# Patient Record
Sex: Female | Born: 1995 | Race: Black or African American | Hispanic: No | Marital: Single | Smoking: Never smoker
Health system: Southern US, Community
[De-identification: ages and names within clinical notes are randomized; demographics above are authoritative.]

## PROBLEM LIST (undated history)

## (undated) DIAGNOSIS — J45909 Unspecified asthma, uncomplicated: Secondary | ICD-10-CM

## (undated) HISTORY — DX: Unspecified asthma, uncomplicated: J45.909

---
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
## 2014-01-13 ENCOUNTER — Encounter: Payer: Self-pay | Admitting: *Deleted

## 2014-01-14 ENCOUNTER — Encounter: Payer: Self-pay | Admitting: Obstetrics & Gynecology

## 2015-11-03 ENCOUNTER — Emergency Department (HOSPITAL_COMMUNITY): Payer: No Typology Code available for payment source

## 2015-11-03 ENCOUNTER — Emergency Department (HOSPITAL_COMMUNITY)
Admission: EM | Admit: 2015-11-03 | Discharge: 2015-11-04 | Disposition: A | Discharge status: Home / Self Care | Payer: No Typology Code available for payment source | Attending: Emergency Medicine | Admitting: Emergency Medicine

## 2015-11-03 ENCOUNTER — Encounter (HOSPITAL_COMMUNITY): Payer: Self-pay | Admitting: *Deleted

## 2015-11-03 DIAGNOSIS — F17200 Nicotine dependence, unspecified, uncomplicated: Secondary | ICD-10-CM | POA: Insufficient documentation

## 2015-11-03 DIAGNOSIS — S39012A Strain of muscle, fascia and tendon of lower back, initial encounter: Secondary | ICD-10-CM | POA: Diagnosis not present

## 2015-11-03 DIAGNOSIS — J45909 Unspecified asthma, uncomplicated: Secondary | ICD-10-CM | POA: Insufficient documentation

## 2015-11-03 DIAGNOSIS — Y939 Activity, unspecified: Secondary | ICD-10-CM | POA: Diagnosis not present

## 2015-11-03 DIAGNOSIS — Y92410 Unspecified street and highway as the place of occurrence of the external cause: Secondary | ICD-10-CM | POA: Diagnosis not present

## 2015-11-03 DIAGNOSIS — Y999 Unspecified external cause status: Secondary | ICD-10-CM | POA: Insufficient documentation

## 2015-11-03 DIAGNOSIS — M542 Cervicalgia: Secondary | ICD-10-CM | POA: Insufficient documentation

## 2015-11-03 DIAGNOSIS — S3992XA Unspecified injury of lower back, initial encounter: Secondary | ICD-10-CM | POA: Diagnosis present

## 2015-11-03 LAB — POC URINE PREG, ED: Preg Test, Ur: NEGATIVE

## 2015-11-03 MED ORDER — NAPROXEN 500 MG PO TABS: 500.0000 mg | ORAL_TABLET | Freq: Two times a day (BID) | ORAL | 0 refills | Status: DC

## 2015-11-03 MED ORDER — HYDROCODONE-ACETAMINOPHEN 5-325 MG PO TABS
1.0000 | ORAL_TABLET | Freq: Once | ORAL | Status: AC
  Administered 2015-11-03: 1 via ORAL
  Filled 2015-11-03: qty 1

## 2015-11-03 MED ORDER — CYCLOBENZAPRINE HCL 10 MG PO TABS
10.0000 mg | ORAL_TABLET | Freq: Once | ORAL | Status: AC
  Administered 2015-11-03: 10 mg via ORAL
  Filled 2015-11-03: qty 1

## 2015-11-03 MED ORDER — METHOCARBAMOL 500 MG PO TABS: 500.0000 mg | ORAL_TABLET | Freq: Two times a day (BID) | ORAL | 0 refills | Status: DC

## 2015-11-03 NOTE — ED Notes (Signed)
NP to see and assess patient before RN assessment. See NP note. 
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
## 2015-11-03 NOTE — ED Notes (Signed)
Pt ambulatory to restroom with steady gait.
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
## 2015-11-03 NOTE — ED Notes (Signed)
EMS reported HR 90, R 16, BP 144/88 CBG 120

## 2015-11-03 NOTE — Discharge Instructions (Signed)
Do not drive while taking the muscle relaxant as it will make you sleepy. °

## 2015-11-04 NOTE — ED Notes (Signed)
Patient verbalized understanding of discharge instructions and denies any further needs or questions at this time. VS stable. Patient ambulatory with steady gait, RN escorted to ED entrance in wheelchair.   

## 2016-10-11 ENCOUNTER — Encounter (HOSPITAL_COMMUNITY): Payer: Self-pay | Admitting: Emergency Medicine

## 2016-10-11 ENCOUNTER — Emergency Department (HOSPITAL_COMMUNITY)
Admission: EM | Admit: 2016-10-11 | Discharge: 2016-10-11 | Discharge status: Against Medical Advice | Payer: Self-pay | Attending: Emergency Medicine | Admitting: Emergency Medicine

## 2016-10-11 DIAGNOSIS — Z5321 Procedure and treatment not carried out due to patient leaving prior to being seen by health care provider: Secondary | ICD-10-CM | POA: Insufficient documentation

## 2016-10-11 NOTE — ED Notes (Signed)
No answer

## 2016-10-11 NOTE — ED Notes (Signed)
No responce 
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
## 2017-09-09 ENCOUNTER — Emergency Department (HOSPITAL_COMMUNITY)
Admission: EM | Admit: 2017-09-09 | Discharge: 2017-09-09 | Disposition: A | Discharge status: Home / Self Care | Payer: Self-pay | Attending: Emergency Medicine | Admitting: Emergency Medicine

## 2017-09-09 ENCOUNTER — Encounter (HOSPITAL_COMMUNITY): Payer: Self-pay | Admitting: Emergency Medicine

## 2017-09-09 DIAGNOSIS — Y929 Unspecified place or not applicable: Secondary | ICD-10-CM | POA: Insufficient documentation

## 2017-09-09 DIAGNOSIS — F17200 Nicotine dependence, unspecified, uncomplicated: Secondary | ICD-10-CM | POA: Insufficient documentation

## 2017-09-09 DIAGNOSIS — M25561 Pain in right knee: Secondary | ICD-10-CM | POA: Insufficient documentation

## 2017-09-09 DIAGNOSIS — R51 Headache: Secondary | ICD-10-CM | POA: Insufficient documentation

## 2017-09-09 DIAGNOSIS — Y999 Unspecified external cause status: Secondary | ICD-10-CM | POA: Insufficient documentation

## 2017-09-09 DIAGNOSIS — R519 Headache, unspecified: Secondary | ICD-10-CM

## 2017-09-09 DIAGNOSIS — M25562 Pain in left knee: Secondary | ICD-10-CM | POA: Insufficient documentation

## 2017-09-09 DIAGNOSIS — M25512 Pain in left shoulder: Secondary | ICD-10-CM | POA: Insufficient documentation

## 2017-09-09 DIAGNOSIS — Z79899 Other long term (current) drug therapy: Secondary | ICD-10-CM | POA: Insufficient documentation

## 2017-09-09 DIAGNOSIS — J45909 Unspecified asthma, uncomplicated: Secondary | ICD-10-CM | POA: Insufficient documentation

## 2017-09-09 DIAGNOSIS — Y939 Activity, unspecified: Secondary | ICD-10-CM | POA: Insufficient documentation

## 2017-09-09 MED ORDER — METHOCARBAMOL 500 MG PO TABS: 500.0000 mg | ORAL_TABLET | Freq: Two times a day (BID) | ORAL | 0 refills | Status: DC

## 2017-09-09 NOTE — ED Notes (Signed)
Declined W/C at D/C and was escorted to lobby by RN. 

## 2017-09-09 NOTE — Discharge Instructions (Signed)
Take NSAIDs or Tylenol as needed for the next week. Take this medicine with food. °Take muscle relaxer at bedtime to help you sleep. This medicine makes you drowsy so do not take before driving or work °Use a heating pad for sore muscles - use for 20 minutes several times a day °Return for worsening symptoms ° °
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
## 2018-12-18 ENCOUNTER — Emergency Department (HOSPITAL_COMMUNITY)
Admission: EM | Admit: 2018-12-18 | Discharge: 2018-12-18 | Disposition: A | Discharge status: Home / Self Care | Payer: Self-pay | Attending: Emergency Medicine | Admitting: Emergency Medicine

## 2018-12-18 ENCOUNTER — Encounter (HOSPITAL_COMMUNITY): Payer: Self-pay

## 2018-12-18 ENCOUNTER — Other Ambulatory Visit: Payer: Self-pay

## 2018-12-18 DIAGNOSIS — Y92008 Other place in unspecified non-institutional (private) residence as the place of occurrence of the external cause: Secondary | ICD-10-CM | POA: Insufficient documentation

## 2018-12-18 DIAGNOSIS — J45909 Unspecified asthma, uncomplicated: Secondary | ICD-10-CM | POA: Insufficient documentation

## 2018-12-18 DIAGNOSIS — Y999 Unspecified external cause status: Secondary | ICD-10-CM | POA: Insufficient documentation

## 2018-12-18 DIAGNOSIS — M542 Cervicalgia: Secondary | ICD-10-CM | POA: Insufficient documentation

## 2018-12-18 DIAGNOSIS — M545 Low back pain: Secondary | ICD-10-CM | POA: Insufficient documentation

## 2018-12-18 DIAGNOSIS — Y9301 Activity, walking, marching and hiking: Secondary | ICD-10-CM | POA: Insufficient documentation

## 2018-12-18 DIAGNOSIS — W19XXXA Unspecified fall, initial encounter: Secondary | ICD-10-CM

## 2018-12-18 DIAGNOSIS — F17200 Nicotine dependence, unspecified, uncomplicated: Secondary | ICD-10-CM | POA: Insufficient documentation

## 2018-12-18 DIAGNOSIS — W010XXA Fall on same level from slipping, tripping and stumbling without subsequent striking against object, initial encounter: Secondary | ICD-10-CM | POA: Insufficient documentation

## 2018-12-18 DIAGNOSIS — M7918 Myalgia, other site: Secondary | ICD-10-CM

## 2018-12-18 DIAGNOSIS — M25552 Pain in left hip: Secondary | ICD-10-CM | POA: Insufficient documentation

## 2018-12-18 MED ORDER — METHOCARBAMOL 500 MG PO TABS: 500.0000 mg | ORAL_TABLET | Freq: Two times a day (BID) | ORAL | 0 refills | Status: DC

## 2018-12-18 MED ORDER — NAPROXEN 500 MG PO TABS: 500.0000 mg | ORAL_TABLET | Freq: Two times a day (BID) | ORAL | 0 refills | Status: DC

## 2018-12-18 NOTE — ED Provider Notes (Signed)
[facility]-EMERGENCY DEPT Provider Note   CSN: [id number] Arrival date & time: 12/18/18  1503     History   Chief Complaint Chief Complaint  Patient presents with  . Fall    HPI Sara Mcconnell is a 23 y.o. female with PMHx asthma who presents to the ED today complaining of gradual onset, constant, left lateral neck pain s/p mechanical fall that occurred yesterday.  Patient reports that she was walking out of her house yesterday when she slipped on a puddle of water outside her door and fell straight backwards.  No head injury or loss of consciousness.  States she fell directly onto her back on the concrete patio.  She was able to get up immediately and states she was not having any pain after the incident but a couple of hours later started noticing pain to her left lateral neck, left lower back, left hip.  Patient states that she has been able to ambulate without difficulty although notices pain when she is walking.  Patient has taken ibuprofen and Advil PM with mild relief.  States that she woke up this morning she had worsening pain which prompted her to come to the ED today.  No other complaints at this time.  Denies fever, chills, weakness, numbness, paresthesias, saddle anesthesia, urinary retention, urinary or bowel incontinence, any other associated symptoms.        Past Medical History:  Diagnosis Date  . Asthma     Patient Active Problem List   Diagnosis Date Noted  . Chlamydia trachomatis infection 03/01/2013    History reviewed. No pertinent surgical history.   OB History   No obstetric history on file.      Home Medications    Prior to Admission medications   Medication Sig Start Date End Date Taking? Authorizing Provider  methocarbamol (ROBAXIN) 500 MG tablet Take 1 tablet (500 mg total) by mouth 2 (two) times daily. 12/18/18   Hyman Hopes, Gerard Cantara, PA-C  naproxen (NAPROSYN) 500 MG tablet Take 1 tablet (500 mg total) by mouth 2 (two)  times daily. 12/18/18   Tanda Rockers, PA-C    Family History Family History  Problem Relation Age of Onset  . Cancer Mother     Social History Social History   Tobacco Use  . Smoking status: Current Every Day Smoker  . Smokeless tobacco: Never Used  Substance Use Topics  . Alcohol use: Yes    Comment: ocassionally  . Drug use: No     Allergies   Penicillins   Review of Systems Review of Systems  Constitutional: Negative for chills and fever.  Eyes: Negative for visual disturbance.  Gastrointestinal: Negative for nausea and vomiting.  Musculoskeletal: Positive for arthralgias, back pain and neck pain.  Neurological: Negative for syncope and headaches.     Physical Exam Updated Vital Signs BP (!) 157/102 (BP Location: Left Arm)   Pulse 75   Temp 98.3 F (36.8 C) (Oral)   Resp 17   LMP 12/18/2018   SpO2 100%   Physical Exam Vitals signs and nursing note reviewed.  Constitutional:      Appearance: She is not ill-appearing or diaphoretic.  HENT:     Head: Normocephalic and atraumatic.     Comments: No raccoon's sign or battle's sign.  Eyes:     Conjunctiva/sclera: Conjunctivae normal.  Neck:     Musculoskeletal: Normal range of motion.     Comments: No C midline spinal tenderness. + Left trapezius muscle TTP. ROM intact although  patient reports pain with left rotation of her neck. No pain with right rotation.  Cardiovascular:     Rate and Rhythm: Normal rate and regular rhythm.     Pulses: Normal pulses.  Pulmonary:     Effort: Pulmonary effort is normal.     Breath sounds: Normal breath sounds. No wheezing, rhonchi or rales.  Musculoskeletal:     Comments: No T or L midline spinal tenderness. + Left lumbar paraspinal tenderness to palpation as well as TTP to left hip. Left leg without shortening or external rotation. Pt able to ambulate without obvious limp. Strength 5/5 to BLEs. Sensation intact throughout. 2+ DP and PT pulses.   Skin:    General:  Skin is warm and dry.     Coloration: Skin is not jaundiced.  Neurological:     Mental Status: She is alert.      ED Treatments / Results  Labs (all labs ordered are listed, but only abnormal results are displayed) Labs Reviewed - No data to display  EKG None  Radiology No results found.  Procedures Procedures (including critical care time)  Medications Ordered in ED Medications - No data to display   Initial Impression / Assessment and Plan / ED Course  I have reviewed the triage vital signs and the nursing notes.  Pertinent labs & imaging results that were available during my care of the patient were reviewed by me and considered in my medical decision making (see chart for details).    23 year old female presents the ED status post mechanical fall that occurred yesterday.  Began having gradual pain to her left lateral neck, left lower back, left hip.  Patient able to ambulate without difficulty.  No signs of leg shortening or external rotation.  Pain mostly in musculature of neck, left lower back.  Do not feel patient needs any imaging at this time.  Prescribe muscle relaxers as well as naproxen for pain.  Patient advised to not drive while on the muscle relaxer.  Advised to take at nighttime to help her sleep.  We will give her a couple of days of rest prior to returning to work.  Patient given referral to [facility] as she is requesting information for PCP but does not currently have insurance as her Medicare lapsed.  Strict return precautions discussed with patient.  She is in agreement with plan at this time is stable for discharge home.  This note was prepared using Dragon voice recognition software and may include unintentional dictation errors due to the inherent limitations of voice recognition software.        Final Clinical Impressions(s) / ED Diagnoses   Final diagnoses:  Fall, initial encounter  Musculoskeletal pain    ED Discharge Orders          Ordered    methocarbamol (ROBAXIN) 500 MG tablet  2 times daily     12/18/18 1549    naproxen (NAPROSYN) 500 MG tablet  2 times daily     12/18/18 1549           Eustaquio Maize, PA-C 12/18/18 1550    Dorie Rank, MD 12/22/18 [phone]

## 2018-12-18 NOTE — Discharge Instructions (Signed)
Please take medication as prescribed.   Take muscle relaxers at nighttime to help you sleep. DO NOT DRIVE WHILE ON THIS MEDICATION AS IT COULD MAKE YOU DROWSY. Take Naproxen during the day.   Follow up with [facility] for primary care needs.

## 2018-12-18 NOTE — ED Triage Notes (Addendum)
Patient reports falling yesterday outside of her home. Patient states she slipped on water and went straight back. Patient states hit the back of her neck and back.    C/O neck, lower back pain, and left hip pain.   7/10 sharp pain with exertion   Ambulatory in triage.  A/Ox4  Patient would also like resources to help get her own PCP.

## 2019-10-24 ENCOUNTER — Ambulatory Visit (HOSPITAL_COMMUNITY)
Admission: EM | Admit: 2019-10-24 | Discharge: 2019-10-24 | Disposition: A | Discharge status: Home / Self Care | Payer: Medicaid Other | Attending: Family Medicine | Admitting: Family Medicine

## 2019-10-24 ENCOUNTER — Other Ambulatory Visit: Payer: Self-pay

## 2019-10-24 ENCOUNTER — Encounter (HOSPITAL_COMMUNITY): Payer: Self-pay

## 2019-10-24 DIAGNOSIS — R059 Cough, unspecified: Secondary | ICD-10-CM | POA: Insufficient documentation

## 2019-10-24 DIAGNOSIS — Z20822 Contact with and (suspected) exposure to covid-19: Secondary | ICD-10-CM | POA: Insufficient documentation

## 2019-10-24 DIAGNOSIS — R300 Dysuria: Secondary | ICD-10-CM | POA: Diagnosis present

## 2019-10-24 DIAGNOSIS — N760 Acute vaginitis: Secondary | ICD-10-CM | POA: Diagnosis present

## 2019-10-24 LAB — POCT URINALYSIS DIPSTICK, ED / UC
Bilirubin Urine: NEGATIVE
Glucose, UA: NEGATIVE mg/dL
Hgb urine dipstick: NEGATIVE
Nitrite: POSITIVE — AB
Protein, ur: NEGATIVE mg/dL
Specific Gravity, Urine: 1.025 (ref 1.005–1.030)
Urobilinogen, UA: 1 mg/dL (ref 0.0–1.0)
pH: 6 (ref 5.0–8.0)

## 2019-10-24 LAB — SARS CORONAVIRUS 2 (TAT 6-24 HRS): SARS Coronavirus 2: NEGATIVE

## 2019-10-24 MED ORDER — CEPHALEXIN 500 MG PO CAPS: 500.0000 mg | ORAL_CAPSULE | Freq: Two times a day (BID) | ORAL | 0 refills | Status: DC

## 2019-10-24 MED ORDER — FLUCONAZOLE 150 MG PO TABS: 150.0000 mg | ORAL_TABLET | Freq: Every day | ORAL | 0 refills | Status: DC

## 2019-10-24 NOTE — ED Provider Notes (Signed)
[facility]    CSN: [id number] Arrival date & time: 10/24/19  1113      History   Chief Complaint Chief Complaint  Patient presents with  . Cough  . Vaginitis    HPI Sara Mcconnell is a 24 y.o. female.   HPI  Patient is here with 2 complaints.  Per she has had cough and some runny nose for the last 3 to 4 days.  No fever no chills.  No change in taste or smell.  No headache or body ache.  No known exposure to Covid.  Has not had Covid vaccinations Her second problem is some mild vaginal irritation.  She used an over-the-counter vaginal cream but it did not help.  Also mild dysuria.  No frequency.  No abdominal pain.  No fever or chills.  No nausea or vomiting   Past Medical History:  Diagnosis Date  . Asthma     Patient Active Problem List   Diagnosis Date Noted  . Chlamydia trachomatis infection 03/01/2013    History reviewed. No pertinent surgical history.  OB History   No obstetric history on file.      Home Medications    Prior to Admission medications   Medication Sig Start Date End Date Taking? Authorizing Provider  cephALEXin (KEFLEX) 500 MG capsule Take 1 capsule (500 mg total) by mouth 2 (two) times daily. 10/24/19   Eustace Moore, MD  fluconazole (DIFLUCAN) 150 MG tablet Take 1 tablet (150 mg total) by mouth daily. Repeat in 1 week if needed 10/24/19   Eustace Moore, MD    Family History Family History  Problem Relation Age of Onset  . Cancer Mother     Social History Social History   Tobacco Use  . Smoking status: Current Every Day Smoker  . Smokeless tobacco: Never Used  Vaping Use  . Vaping Use: Never used  Substance Use Topics  . Alcohol use: Yes    Comment: ocassionally  . Drug use: No     Allergies   Penicillins   Review of Systems Review of Systems See HPI  Physical Exam Triage Vital Signs ED Triage Vitals  Enc Vitals Group     BP 10/24/19 1257 (!) 141/79     Pulse Rate 10/24/19 1257 83       Resp 10/24/19 1257 18     Temp 10/24/19 1257 98.3 F (36.8 C)     Temp Source 10/24/19 1257 Oral     SpO2 10/24/19 1257 98 %     Weight --      Height --      Head Circumference --      Peak Flow --      Pain Score 10/24/19 1258 1     Pain Loc --      Pain Edu? --      Excl. in GC? --    No data found.  Updated Vital Signs BP (!) 141/79 (BP Location: Right Arm)   Pulse 83   Temp 98.3 F (36.8 C) (Oral)   Resp 18   LMP 09/27/2019   SpO2 98%       Physical Exam Constitutional:      General: She is not in acute distress.    Appearance: She is well-developed.     Comments: Pleasant.  Overweight.  No acute distress  HENT:     Head: Normocephalic and atraumatic.     Nose:     Comments: Mild nasal congestion with  clear rhinorrhea    Mouth/Throat:     Pharynx: No posterior oropharyngeal erythema.  Eyes:     Conjunctiva/sclera: Conjunctivae normal.     Pupils: Pupils are equal, round, and reactive to light.  Cardiovascular:     Rate and Rhythm: Normal rate and regular rhythm.  Pulmonary:     Effort: Pulmonary effort is normal. No respiratory distress.     Breath sounds: Normal breath sounds.     Comments: Heart and lung exam is normal Abdominal:     General: There is no distension.     Palpations: Abdomen is soft.     Tenderness: There is no abdominal tenderness. There is no right CVA tenderness or left CVA tenderness.  Musculoskeletal:        General: Normal range of motion.     Cervical back: Normal range of motion.  Skin:    General: Skin is warm and dry.  Neurological:     Mental Status: She is alert.  Psychiatric:        Mood and Affect: Mood normal.        Behavior: Behavior normal.      UC Treatments / Results  Labs (all labs ordered are listed, but only abnormal results are displayed) Labs Reviewed  POCT URINALYSIS DIPSTICK, ED / UC - Abnormal; Notable for the following components:      Result Value   Ketones, ur TRACE (*)    Nitrite POSITIVE  (*)    Leukocytes,Ua SMALL (*)    All other components within normal limits  SARS CORONAVIRUS 2 (TAT 6-24 HRS)  URINE CULTURE    EKG   Radiology No results found.  Procedures Procedures (including critical care time)  Medications Ordered in UC Medications - No data to display  Initial Impression / Assessment and Plan / UC Course  I have reviewed the triage vital signs and the nursing notes.  Pertinent labs & imaging results that were available during my care of the patient were reviewed by me and considered in my medical decision making (see chart for details).     Urinalysis is consistent with a urinary tract infection.  We will treat her with Keflex twice a day for 5 days.  She is advised to push fluids.  We will give her Diflucan for the vaginal itching since she states she is prone to yeast infections. Undergoing Covid testing because of her cough.  Importance of quarantine is emphasized. Final Clinical Impressions(s) / UC Diagnoses   Final diagnoses:  Cough  Dysuria  Vaginitis and vulvovaginitis     Discharge Instructions     For the cough, take plenty of fluids.  Use over-the-counter cough medicine as Quarantine until you get Covid results.  They should be available by tomorrow through MyChart  You Have a bladder infection.  I am going to keep you Keflex 1 pill twice a day for 5 days. Drink plenty of fluids Take the Diflucan for yeast infection  Call or return if you have any problems   ED Prescriptions    Medication Sig Dispense Auth. Provider   fluconazole (DIFLUCAN) 150 MG tablet Take 1 tablet (150 mg total) by mouth daily. Repeat in 1 week if needed 2 tablet Eustace Moore, MD   cephALEXin (KEFLEX) 500 MG capsule Take 1 capsule (500 mg total) by mouth 2 (two) times daily. 10 capsule Eustace Moore, MD     PDMP not reviewed this encounter.   Eustace Moore, MD 10/24/19 [phone]

## 2019-10-24 NOTE — Discharge Instructions (Signed)
For the cough, take plenty of fluids.  Use over-the-counter cough medicine as Quarantine until you get Covid results.  They should be available by tomorrow through MyChart  You Have a bladder infection.  I am going to keep you Keflex 1 pill twice a day for 5 days. Drink plenty of fluids Take the Diflucan for yeast infection  Call or return if you have any problems

## 2019-10-24 NOTE — ED Triage Notes (Signed)
Pt presents with non productive cough, nasal drainage, and congestion for past few days.  Pt also complains of vaginal irritation.

## 2019-10-26 LAB — URINE CULTURE: Culture: >=100000 — AB

## 2020-01-02 ENCOUNTER — Other Ambulatory Visit: Payer: Self-pay

## 2020-01-02 ENCOUNTER — Encounter (HOSPITAL_COMMUNITY): Payer: Self-pay | Admitting: Emergency Medicine

## 2020-01-02 ENCOUNTER — Ambulatory Visit (HOSPITAL_COMMUNITY)
Admission: EM | Admit: 2020-01-02 | Discharge: 2020-01-02 | Disposition: A | Discharge status: Home / Self Care | Payer: Medicaid - Out of State | Attending: Family Medicine | Admitting: Family Medicine

## 2020-01-02 DIAGNOSIS — N898 Other specified noninflammatory disorders of vagina: Secondary | ICD-10-CM

## 2020-01-02 DIAGNOSIS — K0889 Other specified disorders of teeth and supporting structures: Secondary | ICD-10-CM

## 2020-01-02 MED ORDER — IBUPROFEN 800 MG PO TABS: 800.0000 mg | ORAL_TABLET | Freq: Three times a day (TID) | ORAL | 0 refills | Status: DC | PRN

## 2020-01-02 MED ORDER — CLINDAMYCIN HCL 300 MG PO CAPS: 300.0000 mg | ORAL_CAPSULE | Freq: Three times a day (TID) | ORAL | 0 refills | Status: AC

## 2020-01-02 MED ORDER — FLUCONAZOLE 150 MG PO TABS: 150.0000 mg | ORAL_TABLET | Freq: Once | ORAL | 0 refills | Status: AC

## 2020-01-02 NOTE — ED Triage Notes (Addendum)
Multiple complaints.  Right bottom, back tooth is hurting.  Patient reports a history of dental pain.  Pain worsened 4 months ago.    Vaginal irritation.  No burn with urination.  Area is itching.    Runny nose last night. Patient reports cough

## 2020-01-02 NOTE — Discharge Instructions (Addendum)
I have given you a referral to a dentist to call.  Take antibiotic as prescribed.  Take Diflucan for yeast.  You will need to call clinic to follow-up on your STI testing since your phone is not working.

## 2020-01-02 NOTE — ED Provider Notes (Signed)
[facility]    CSN: [id number] Arrival date & time: 01/02/20  1340      History   Chief Complaint Chief Complaint  Patient presents with  . Dental Pain    HPI Sara Mcconnell is a 24 y.o. female presents with right-sided dental pain.  Patient reports pain over the last several years with same tooth however she has not been able to afford to have it removed.  She denies any recent fever or chills, no swelling in mouth or drainage.  Patient also reports vaginal irritation and itching after menstrual cycle each month.  She describes thick white discharge without any odor.  Denies any dysuria.  Patient did get relief with Diflucan given in October but symptoms returned with next menstrual cycle.   Past Medical History:  Diagnosis Date  . Asthma     Patient Active Problem List   Diagnosis Date Noted  . Chlamydia trachomatis infection 03/01/2013    History reviewed. No pertinent surgical history.  OB History   No obstetric history on file.      Home Medications    Prior to Admission medications   Medication Sig Start Date End Date Taking? Authorizing Provider  phenazopyridine (PYRIDIUM) 95 MG tablet Take by mouth 3 (three) times daily as needed for pain.   Yes [provider]  Pseudoephedrine-APAP-DM (DAYQUIL PO) Take by mouth.   Yes [provider]  cephALEXin (KEFLEX) 500 MG capsule Take 1 capsule (500 mg total) by mouth 2 (two) times daily. 10/24/19   Eustace Moore, MD  clindamycin (CLEOCIN) 300 MG capsule Take 1 capsule (300 mg total) by mouth 3 (three) times daily for 7 days. 01/02/20 01/09/20  Rolla Etienne, NP  fluconazole (DIFLUCAN) 150 MG tablet Take 1 tablet (150 mg total) by mouth once for 1 dose. 01/02/20 01/02/20  Rolla Etienne, NP  ibuprofen (ADVIL) 800 MG tablet Take 1 tablet (800 mg total) by mouth every 8 (eight) hours as needed. 01/02/20   Rolla Etienne, NP    Family History Family History  Problem Relation  Age of Onset  . Cancer Mother     Social History Social History   Tobacco Use  . Smoking status: Never Smoker  . Smokeless tobacco: Never Used  Vaping Use  . Vaping Use: Never used  Substance Use Topics  . Alcohol use: Yes    Comment: ocassionally  . Drug use: Yes    Types: Marijuana     Allergies   Penicillins   Review of Systems As stated in HPI otherwise negative   Physical Exam Triage Vital Signs ED Triage Vitals  Enc Vitals Group     BP 01/02/20 1500 (!) 143/53     Pulse Rate 01/02/20 1500 65     Resp 01/02/20 1500 18     Temp 01/02/20 1500 98.1 F (36.7 C)     Temp Source 01/02/20 1500 Oral     SpO2 01/02/20 1500 100 %     Weight --      Height --      Head Circumference --      Peak Flow --      Pain Score 01/02/20 1456 10     Pain Loc --      Pain Edu? --      Excl. in GC? --    No data found.  Updated Vital Signs BP (!) 143/53 (BP Location: Right Arm) Comment (BP Location): large cuff  Pulse 65  Temp 98.1 F (36.7 C) (Oral)   Resp 18   LMP 12/23/2019   SpO2 100%      Physical Exam Constitutional:      General: She is not in acute distress.    Appearance: Normal appearance. She is not ill-appearing.  HENT:     Mouth/Throat:     Mouth: Mucous membranes are moist.     Pharynx: Oropharynx is clear.     Comments: Broken left molar on bottom right.  No drainage, no gum swelling Neurological:     Mental Status: She is alert.  Psychiatric:        Mood and Affect: Mood normal.        Behavior: Behavior normal.      UC Treatments / Results  Labs (all labs ordered are listed, but only abnormal results are displayed) Labs Reviewed  CERVICOVAGINAL ANCILLARY ONLY    EKG   Radiology No results found.  Procedures Procedures (including critical care time)  Medications Ordered in UC Medications - No data to display  Initial Impression / Assessment and Plan / UC Course  I have reviewed the triage vital signs and the nursing  notes.  Pertinent labs & imaging results that were available during my care of the patient were reviewed by me and considered in my medical decision making (see chart for details).  Dental pain -Intermittent times several years with known source -Broken bottom right molar -We will treat empirically with clindamycin x7 days -Dental referral given  Vaginal irritation -Treat empirically with Diflucan especially with new antibiotic prescription -STI screening -Patient will call clinic for follow-up as she does not have a working number or MyChart access  Final Clinical Impressions(s) / UC Diagnoses   Final diagnoses:  Pain, dental  Vaginal irritation     Discharge Instructions     I have given you a referral to a dentist to call.  Take antibiotic as prescribed.  Take Diflucan for yeast.  You will need to call clinic to follow-up on your STI testing since your phone is not working.    ED Prescriptions    Medication Sig Dispense Auth. Provider   fluconazole (DIFLUCAN) 150 MG tablet Take 1 tablet (150 mg total) by mouth once for 1 dose. 2 tablet Rolla Etienne, NP   clindamycin (CLEOCIN) 300 MG capsule Take 1 capsule (300 mg total) by mouth 3 (three) times daily for 7 days. 21 capsule Rolla Etienne, NP   ibuprofen (ADVIL) 800 MG tablet Take 1 tablet (800 mg total) by mouth every 8 (eight) hours as needed. 30 tablet Rolla Etienne, NP     PDMP not reviewed this encounter.   Rolla Etienne, NP 01/02/20 1600

## 2020-01-03 ENCOUNTER — Telehealth (HOSPITAL_COMMUNITY): Payer: Self-pay | Admitting: Emergency Medicine

## 2020-01-03 LAB — CERVICOVAGINAL ANCILLARY ONLY
Bacterial Vaginitis (gardnerella): NEGATIVE
Candida Glabrata: NEGATIVE
Candida Vaginitis: POSITIVE — AB
Chlamydia: NEGATIVE
Comment: NEGATIVE
Comment: NEGATIVE
Comment: NEGATIVE
Comment: NEGATIVE
Comment: NEGATIVE
Comment: NORMAL
Neisseria Gonorrhea: NEGATIVE
Trichomonas: POSITIVE — AB

## 2020-01-03 MED ORDER — METRONIDAZOLE 500 MG PO TABS
500.0000 mg | ORAL_TABLET | Freq: Two times a day (BID) | ORAL | 0 refills | Status: DC
Start: 2020-01-03 — End: 2020-08-02

## 2020-05-05 ENCOUNTER — Encounter (HOSPITAL_COMMUNITY): Payer: Self-pay | Admitting: Emergency Medicine

## 2020-05-05 ENCOUNTER — Emergency Department (HOSPITAL_COMMUNITY)
Admission: EM | Admit: 2020-05-05 | Discharge: 2020-05-05 | Disposition: A | Discharge status: Home / Self Care | Payer: Self-pay | Attending: Emergency Medicine | Admitting: Emergency Medicine

## 2020-05-05 ENCOUNTER — Other Ambulatory Visit: Payer: Self-pay

## 2020-05-05 DIAGNOSIS — J45909 Unspecified asthma, uncomplicated: Secondary | ICD-10-CM | POA: Insufficient documentation

## 2020-05-05 DIAGNOSIS — K047 Periapical abscess without sinus: Secondary | ICD-10-CM | POA: Insufficient documentation

## 2020-05-05 MED ORDER — CLINDAMYCIN HCL 150 MG PO CAPS: 450.0000 mg | ORAL_CAPSULE | Freq: Three times a day (TID) | ORAL | 0 refills | Status: AC

## 2020-05-05 NOTE — ED Triage Notes (Signed)
Patient here from home with complaints of dental pain for 6 years. Right upper with no relief.
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
## 2020-05-05 NOTE — Discharge Instructions (Signed)
You have a dental infection, starting you on antibiotics please take as prescribed.  You may take over-the-counter pain medications like ibuprofen and or Tylenol every 6 hours as needed please follow dosing on back a bottle.  It is imperative that you have that tooth removed by a dentist, I have given you resources above please call dentist to have this fixed.  Come back to the emergency department if you develop chest pain, shortness of breath, severe abdominal pain, uncontrolled nausea, vomiting, diarrhea.

## 2020-08-02 ENCOUNTER — Emergency Department (HOSPITAL_COMMUNITY)
Admission: EM | Admit: 2020-08-02 | Discharge: 2020-08-02 | Disposition: A | Discharge status: Home / Self Care | Payer: Medicaid - Out of State | Attending: Emergency Medicine | Admitting: Emergency Medicine

## 2020-08-02 ENCOUNTER — Other Ambulatory Visit: Payer: Self-pay

## 2020-08-02 ENCOUNTER — Encounter (HOSPITAL_COMMUNITY): Payer: Self-pay

## 2020-08-02 DIAGNOSIS — K0889 Other specified disorders of teeth and supporting structures: Secondary | ICD-10-CM | POA: Insufficient documentation

## 2020-08-02 DIAGNOSIS — J45909 Unspecified asthma, uncomplicated: Secondary | ICD-10-CM | POA: Insufficient documentation

## 2020-08-02 MED ORDER — IBUPROFEN 600 MG PO TABS: 600.0000 mg | ORAL_TABLET | Freq: Four times a day (QID) | ORAL | 0 refills | Status: DC | PRN

## 2020-08-02 MED ORDER — CLINDAMYCIN HCL 150 MG PO CAPS: 450.0000 mg | ORAL_CAPSULE | Freq: Three times a day (TID) | ORAL | 0 refills | Status: AC

## 2020-08-02 MED ORDER — LIDOCAINE VISCOUS HCL 2 % MT SOLN: 15.0000 mL | OROMUCOSAL | 2 refills | Status: DC | PRN

## 2020-08-02 MED ORDER — BUPIVACAINE-EPINEPHRINE (PF) 0.5% -1:200000 IJ SOLN
1.8000 mL | Freq: Once | INTRAMUSCULAR | Status: AC
  Administered 2020-08-02: 1.8 mL
  Filled 2020-08-02: qty 1.8

## 2020-08-02 NOTE — ED Provider Notes (Signed)
[facility]-EMERGENCY DEPT Provider Note   CSN: [id number] Arrival date & time: 08/02/20  1934     History Chief Complaint  Patient presents with   Dental Pain    Sara Mcconnell is a 25 y.o. female.   Dental Pain Associated symptoms: no facial swelling, no fever and no neck pain       Sara Mcconnell is a 25 y.o. female, with a history of asthma, presenting to the ED with dental pain in the area of the right lower molars for the last several months, worse over the last several days.  Patient was told previously she would need tooth extraction, but could not afford it at that time. Denies fever, facial swelling, drainage, difficulty swallowing, nausea/vomiting, or any other complaints.    Past Medical History:  Diagnosis Date   Asthma     Patient Active Problem List   Diagnosis Date Noted   Chlamydia trachomatis infection 03/01/2013    History reviewed. No pertinent surgical history.   OB History   No obstetric history on file.     Family History  Problem Relation Age of Onset   Cancer Mother     Social History   Tobacco Use   Smoking status: Never   Smokeless tobacco: Never  Vaping Use   Vaping Use: Never used  Substance Use Topics   Alcohol use: Yes    Comment: ocassionally   Drug use: Yes    Types: Marijuana    Home Medications Prior to Admission medications   Medication Sig Start Date End Date Taking? Authorizing Provider  clindamycin (CLEOCIN) 150 MG capsule Take 3 capsules (450 mg total) by mouth 3 (three) times daily for 7 days. 08/02/20 08/09/20 Yes Kathrin Folden C, PA-C  ibuprofen (ADVIL) 600 MG tablet Take 1 tablet (600 mg total) by mouth every 6 (six) hours as needed. 08/02/20  Yes Amahri Dengel C, PA-C  lidocaine (XYLOCAINE) 2 % solution Use as directed 15 mLs in the mouth or throat as needed for mouth pain. 08/02/20  Yes Annajulia Lewing C, PA-C  phenazopyridine (PYRIDIUM) 95 MG tablet Take by mouth 3 (three)  times daily as needed for pain.    [provider]  Pseudoephedrine-APAP-DM (DAYQUIL PO) Take by mouth.    [provider]    Allergies    Penicillins  Review of Systems   Review of Systems  Constitutional:  Negative for chills and fever.  HENT:  Positive for dental problem. Negative for facial swelling, sore throat, trouble swallowing and voice change.   Respiratory:  Negative for shortness of breath.   Cardiovascular:  Negative for chest pain.  Gastrointestinal:  Negative for nausea and vomiting.  Musculoskeletal:  Negative for neck pain and neck stiffness.   Physical Exam Updated Vital Signs BP (!) 170/94 (BP Location: Right Arm)   Pulse 74   Temp 98.3 F (36.8 C) (Oral)   Resp 16   Ht 5\' 4"  (1.626 m)   Wt 104.3 kg   SpO2 98%   BMI 39.48 kg/m   Physical Exam Vitals and nursing note reviewed.  Constitutional:      General: She is not in acute distress.    Appearance: Normal appearance. She is well-developed. She is not diaphoretic.  HENT:     Head: Normocephalic and atraumatic.     Mouth/Throat:     Comments: Patient indicates pain to the right mandibular molar region.  One of the molars is eroded down to the gumline.  No noted  pulp exposure.  No noted area of intraoral swelling or fluctuance.  No trismus or noted abnormal phonation.  Mouth opening to at least 3 finger widths.  Handles oral secretions without difficulty.  No noted facial swelling.  No sublingual swelling or tongue elevation.  No swelling or tenderness to the submental or submandibular regions.  No swelling or tenderness into the soft tissues of the neck. Eyes:     Conjunctiva/sclera: Conjunctivae normal.  Cardiovascular:     Rate and Rhythm: Normal rate and regular rhythm.  Pulmonary:     Effort: Pulmonary effort is normal.  Musculoskeletal:     Cervical back: Neck supple.  Skin:    General: Skin is warm and dry.     Coloration: Skin is not pale.  Neurological:     Mental Status:  She is alert.  Psychiatric:        Behavior: Behavior normal.    ED Results / Procedures / Treatments   Labs (all labs ordered are listed, but only abnormal results are displayed) Labs Reviewed - No data to display  EKG None  Radiology No results found.  Procedures Dental Block  Date/Time: 08/02/2020 9:00 PM Performed by: Anselm Pancoast, PA-C Authorized by: Anselm Pancoast, PA-C   Consent:    Consent obtained:  Verbal   Consent given by:  Patient   Risks, benefits, and alternatives were discussed: yes     Risks discussed:  Allergic reaction, hematoma, intravascular injection, infection, nerve damage, pain, unsuccessful block and swelling Universal protocol:    Procedure explained and questions answered to patient or proxy's satisfaction: yes     Patient identity confirmed:  Verbally with patient and provided demographic data Indications:    Indications: dental pain   Location:    Block type:  Inferior alveolar   Laterality:  Right Procedure details:    Topical anesthetic:  Benzocaine gel   Syringe type:  Controlled syringe   Needle gauge:  27 G   Anesthetic injected:  Bupivacaine 0.5% WITH epi   Injection procedure:  Anatomic landmarks identified, anatomic landmarks palpated, introduced needle, negative aspiration for blood and incremental injection Post-procedure details:    Outcome:  Anesthesia achieved   Procedure completion:  Tolerated well, no immediate complications   Medications Ordered in ED Medications  bupivacaine-epinephrine (MARCAINE W/ EPI) 0.5% -1:200000 injection 1.8 mL (1.8 mLs Infiltration Given by Other 08/02/20 2104)    ED Course  I have reviewed the triage vital signs and the nursing notes.  Pertinent labs & imaging results that were available during my care of the patient were reviewed by me and considered in my medical decision making (see chart for details).    MDM Rules/Calculators/A&P                          Patient presents with dental  pain.  Low suspicion for sepsis or Ludewig's angioedema. Improvement with dental block without immediate complication. Dental follow-up recommended.  Resources given. The patient was given instructions for home care as well as return precautions. Patient voices understanding of these instructions, accepts the plan, and is comfortable with discharge.     Final Clinical Impression(s) / ED Diagnoses Final diagnoses:  Pain, dental    Rx / DC Orders ED Discharge Orders          Ordered    clindamycin (CLEOCIN) 150 MG capsule  3 times daily        08/02/20 2120  lidocaine (XYLOCAINE) 2 % solution  As needed        08/02/20 2120    ibuprofen (ADVIL) 600 MG tablet  Every 6 hours PRN        08/02/20 2120             Anselm Pancoast, PA-C 08/03/20 0145    Mancel Bale, MD 08/04/20 0011

## 2020-08-02 NOTE — ED Triage Notes (Signed)
Pt complains of dental pain to her right bottom wisdom tooth. Pt states that the medication she has been taking is not working anymore.

## 2020-08-02 NOTE — Discharge Instructions (Addendum)
  Dental Pain You have been seen today for dental pain. You should follow up with a dentist as soon as possible. This problem will not resolve on its own without the care of a dentist.  Lidocaine liquid: Use the viscous lidocaine for mouth pain. Swish with the lidocaine and spit it out. Do not swallow it. Salt water solution: You should also swish with a homemade salt water solution, twice a day.  Make this solution by mixing 8 ounces of warm water with about half a teaspoon of salt.  The solution should be barely salty. If it is too salty, add more water. Antiinflammatory medications: Take 600 mg of ibuprofen every 6 hours or 440 mg (over the counter dose) to 500 mg (prescription dose) of naproxen every 12 hours for the next 3 days. After this time, these medications may be used as needed for pain. Take these medications with food to avoid upset stomach. Choose only one of these medications, do not take them together. Acetaminophen (generic for Tylenol): Should you continue to have additional pain while taking the ibuprofen or naproxen, you may add in acetaminophen as needed. Your daily total maximum amount of acetaminophen from all sources should be limited to 4000mg/day for persons without liver problems, or 2000mg/day for those with liver problems.  Please take all of your antibiotics until finished!   You may develop abdominal discomfort or diarrhea from the antibiotic.  You may help offset this with probiotics which you can buy or get in yogurt. Do not eat or take the probiotics until 2 hours after your antibiotic.   For prescription assistance, may try using prescription discount sites or apps, such as goodrx.com  Dental Resource Guide  [facility] [address], [address] [place], [place] [address] [phone]  [facility] [place] [facility] [address] [place], [place] [address] [phone]  [facility] [address] [place], [place] [address] [phone]  ext. [phone]  [facility] [address] [place], [place] [address] [phone]  [facility] [address] [place], [place] [address] [phone]  [employer] Www.denistry.[employer].edu/patientcare/studentclinics/becomepatient  [employer] [address] [place], [place] [address] [phone]  Website for free, low-income, or sliding scale dental services in [place]: www.freedentalcare.us  To find a dentist in [place] and surrounding areas: www.ncdental.org/for-the-public/find-a-dentist  [facility] http://www.ncdental.org/meetings-events/[place]   Medicaid Dentist https://dma.ncdhhs.gov/find-a-doctor/medicaid-dental-providers  

## 2022-05-22 ENCOUNTER — Emergency Department (HOSPITAL_COMMUNITY): Payer: No Typology Code available for payment source

## 2022-05-22 ENCOUNTER — Emergency Department (HOSPITAL_COMMUNITY)
Admission: EM | Admit: 2022-05-22 | Discharge: 2022-05-23 | Disposition: A | Discharge status: Home / Self Care | Payer: No Typology Code available for payment source | Attending: Emergency Medicine | Admitting: Emergency Medicine

## 2022-05-22 ENCOUNTER — Other Ambulatory Visit: Payer: Self-pay

## 2022-05-22 DIAGNOSIS — R1030 Lower abdominal pain, unspecified: Secondary | ICD-10-CM | POA: Diagnosis not present

## 2022-05-22 DIAGNOSIS — R102 Pelvic and perineal pain: Secondary | ICD-10-CM | POA: Insufficient documentation

## 2022-05-22 DIAGNOSIS — R519 Headache, unspecified: Secondary | ICD-10-CM | POA: Insufficient documentation

## 2022-05-22 DIAGNOSIS — M79652 Pain in left thigh: Secondary | ICD-10-CM | POA: Diagnosis not present

## 2022-05-22 DIAGNOSIS — Y92096 Garden or yard of other non-institutional residence as the place of occurrence of the external cause: Secondary | ICD-10-CM | POA: Diagnosis not present

## 2022-05-22 DIAGNOSIS — M79651 Pain in right thigh: Secondary | ICD-10-CM | POA: Insufficient documentation

## 2022-05-22 LAB — CBC
HCT: 41.2 % (ref 36.0–46.0)
Hemoglobin: 14.2 g/dL (ref 12.0–15.0)
MCH: 32.9 pg (ref 26.0–34.0)
MCHC: 34.5 g/dL (ref 30.0–36.0)
MCV: 95.4 fL (ref 80.0–100.0)
Platelets: 346 10*3/uL (ref 150–400)
RBC: 4.32 MIL/uL (ref 3.87–5.11)
RDW: 12.2 % (ref 11.5–15.5)
WBC: 13.6 10*3/uL — ABNORMAL HIGH (ref 4.0–10.5)
nRBC: 0 % (ref 0.0–0.2)

## 2022-05-22 LAB — COMPREHENSIVE METABOLIC PANEL
ALT: 19 U/L (ref 0–44)
AST: 19 U/L (ref 15–41)
Albumin: 4.6 g/dL (ref 3.5–5.0)
Alkaline Phosphatase: 55 U/L (ref 38–126)
Anion gap: 9 (ref 5–15)
BUN: 15 mg/dL (ref 6–20)
CO2: 21 mmol/L — ABNORMAL LOW (ref 22–32)
Calcium: 9.6 mg/dL (ref 8.9–10.3)
Chloride: 105 mmol/L (ref 98–111)
Creatinine, Ser: 1.14 mg/dL — ABNORMAL HIGH (ref 0.44–1.00)
GFR, Estimated: >60 mL/min (ref >60)
Glucose, Bld: 108 mg/dL — ABNORMAL HIGH (ref 70–99)
Potassium: 3.4 mmol/L — ABNORMAL LOW (ref 3.5–5.1)
Sodium: 135 mmol/L (ref 135–145)
Total Bilirubin: 0.7 mg/dL (ref 0.3–1.2)
Total Protein: 8.3 g/dL — ABNORMAL HIGH (ref 6.5–8.1)

## 2022-05-22 LAB — I-STAT CHEM 8, ED
BUN: 17 mg/dL (ref 6–20)
Calcium, Ion: 1.2 mmol/L (ref 1.15–1.40)
Chloride: 107 mmol/L (ref 98–111)
Creatinine, Ser: 1.1 mg/dL — ABNORMAL HIGH (ref 0.44–1.00)
Glucose, Bld: 105 mg/dL — ABNORMAL HIGH (ref 70–99)
HCT: 45 % (ref 36.0–46.0)
Hemoglobin: 15.3 g/dL — ABNORMAL HIGH (ref 12.0–15.0)
Potassium: 3.6 mmol/L (ref 3.5–5.1)
Sodium: 140 mmol/L (ref 135–145)
TCO2: 22 mmol/L (ref 22–32)

## 2022-05-22 LAB — SAMPLE TO BLOOD BANK

## 2022-05-22 LAB — PROTIME-INR
INR: 1.1 (ref 0.8–1.2)
Prothrombin Time: 13.7 seconds (ref 11.4–15.2)

## 2022-05-22 LAB — I-STAT BETA HCG BLOOD, ED (MC, WL, AP ONLY): I-stat hCG, quantitative: <5 m[IU]/mL (ref <5)

## 2022-05-22 LAB — LACTIC ACID, PLASMA: Lactic Acid, Venous: 1 mmol/L (ref 0.5–1.9)

## 2022-05-22 LAB — ETHANOL: Alcohol, Ethyl (B): <10 mg/dL (ref <10)

## 2022-05-22 MED ORDER — ONDANSETRON HCL 4 MG/2ML IJ SOLN
4.0000 mg | Freq: Once | INTRAMUSCULAR | Status: AC
  Administered 2022-05-22: 4 mg via INTRAVENOUS
  Filled 2022-05-22: qty 2

## 2022-05-22 MED ORDER — ACETAMINOPHEN 325 MG PO TABS: 650.0000 mg | ORAL_TABLET | Freq: Once | ORAL | Status: DC

## 2022-05-22 MED ORDER — MORPHINE SULFATE (PF) 2 MG/ML IV SOLN
2.0000 mg | Freq: Once | INTRAVENOUS | Status: AC
  Administered 2022-05-22: 2 mg via INTRAVENOUS
  Filled 2022-05-22: qty 1

## 2022-05-22 MED ORDER — IOHEXOL 350 MG/ML SOLN
100.0000 mL | Freq: Once | INTRAVENOUS | Status: AC | PRN
  Administered 2022-05-22: 100 mL via INTRAVENOUS

## 2022-05-22 MED ORDER — METHOCARBAMOL 500 MG PO TABS: 500.0000 mg | ORAL_TABLET | Freq: Two times a day (BID) | ORAL | 0 refills | Status: DC

## 2022-05-22 MED ORDER — MORPHINE SULFATE (PF) 4 MG/ML IV SOLN: 4.0000 mg | Freq: Once | INTRAVENOUS | Status: DC

## 2022-05-22 MED ORDER — NAPROXEN 500 MG PO TABS: 500.0000 mg | ORAL_TABLET | Freq: Two times a day (BID) | ORAL | 0 refills | Status: DC

## 2022-05-22 MED ORDER — ONDANSETRON HCL 4 MG/2ML IJ SOLN
4.0000 mg | Freq: Once | INTRAMUSCULAR | Status: AC
  Administered 2022-05-22: 4 mg via INTRAVENOUS
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
## 2022-05-22 NOTE — ED Notes (Signed)
New C-collar applied.
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
## 2022-05-22 NOTE — ED Notes (Signed)
Attempted to escort pt back to CT, MD currently suturing.
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
## 2022-05-22 NOTE — ED Notes (Signed)
Transported to CT 

## 2022-05-22 NOTE — Discharge Instructions (Signed)

## 2023-03-05 ENCOUNTER — Ambulatory Visit (INDEPENDENT_AMBULATORY_CARE_PROVIDER_SITE_OTHER): Payer: Self-pay

## 2023-03-05 ENCOUNTER — Ambulatory Visit (HOSPITAL_COMMUNITY)
Admission: EM | Admit: 2023-03-05 | Discharge: 2023-03-05 | Disposition: A | Discharge status: Home / Self Care | Payer: Self-pay | Attending: Emergency Medicine | Admitting: Emergency Medicine

## 2023-03-05 ENCOUNTER — Encounter (HOSPITAL_COMMUNITY): Payer: Self-pay

## 2023-03-05 DIAGNOSIS — R0789 Other chest pain: Secondary | ICD-10-CM

## 2023-03-05 DIAGNOSIS — M79662 Pain in left lower leg: Secondary | ICD-10-CM

## 2023-03-05 DIAGNOSIS — M542 Cervicalgia: Secondary | ICD-10-CM

## 2023-03-05 DIAGNOSIS — M546 Pain in thoracic spine: Secondary | ICD-10-CM

## 2023-03-05 MED ORDER — METHOCARBAMOL 500 MG PO TABS: 500.0000 mg | ORAL_TABLET | Freq: Two times a day (BID) | ORAL | 0 refills | Status: DC

## 2023-03-05 MED ORDER — LIDOCAINE 5 % EX PTCH: 1.0000 | MEDICATED_PATCH | CUTANEOUS | 0 refills | Status: DC

## 2023-03-05 MED ORDER — KETOROLAC TROMETHAMINE 30 MG/ML IJ SOLN
30.0000 mg | Freq: Once | INTRAMUSCULAR | Status: AC
  Administered 2023-03-05: 30 mg via INTRAMUSCULAR

## 2023-03-05 MED ORDER — KETOROLAC TROMETHAMINE 30 MG/ML IJ SOLN
INTRAMUSCULAR | Status: AC
  Filled 2023-03-05: qty 1
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
## 2023-03-05 NOTE — Discharge Instructions (Addendum)
 Your chest x-ray did not reveal any acute injuries.  I have prescribed Robaxin which is a muscle relaxer that you can take as needed for muscle pain and spasms.  This can make you drowsy so do not drive, work, or drink while taking.    I have also prescribed lidocaine patches that you can apply as needed for pain.  Otherwise alternate between Tylenol and ibuprofen every 4-6 hours as needed for pain and headache.  You can alternate between heat and ice as needed for pain.    If your pain persist you can follow-up with [facility] or return here for reevaluation.
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
## 2023-07-07 ENCOUNTER — Encounter (HOSPITAL_COMMUNITY): Payer: Self-pay

## 2023-07-07 ENCOUNTER — Ambulatory Visit (HOSPITAL_COMMUNITY)
Admission: EM | Admit: 2023-07-07 | Discharge: 2023-07-07 | Disposition: A | Discharge status: Home / Self Care | Payer: Self-pay | Attending: Family Medicine | Admitting: Family Medicine

## 2023-07-07 DIAGNOSIS — M545 Low back pain, unspecified: Secondary | ICD-10-CM

## 2023-07-07 MED ORDER — TIZANIDINE HCL 4 MG PO TABS: 4.0000 mg | ORAL_TABLET | Freq: Three times a day (TID) | ORAL | 0 refills | Status: AC | PRN

## 2023-07-07 MED ORDER — KETOROLAC TROMETHAMINE 10 MG PO TABS: 10.0000 mg | ORAL_TABLET | Freq: Four times a day (QID) | ORAL | 0 refills | Status: AC | PRN

## 2023-07-07 MED ORDER — KETOROLAC TROMETHAMINE 30 MG/ML IJ SOLN
30.0000 mg | Freq: Once | INTRAMUSCULAR | Status: AC
  Administered 2023-07-07: 30 mg via INTRAMUSCULAR

## 2023-07-07 MED ORDER — KETOROLAC TROMETHAMINE 30 MG/ML IJ SOLN
INTRAMUSCULAR | Status: AC
  Filled 2023-07-07: qty 1

## 2023-07-07 NOTE — Discharge Instructions (Signed)
You have been given a shot of Toradol 30 mg today.  Ketorolac 10 mg tablets--take 1 tablet every 6 hours as needed for pain.  This is the same medicine that is in the shot we just gave you   Take tizanidine 4 mg--1 every 8 hours as needed for muscle spasms; this medication can cause dizziness and sleepiness
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
## 2023-08-16 ENCOUNTER — Ambulatory Visit: Payer: Self-pay | Admitting: Family Medicine

## 2023-08-17 ENCOUNTER — Ambulatory Visit (HOSPITAL_COMMUNITY)
Admission: EM | Admit: 2023-08-17 | Discharge: 2023-08-17 | Disposition: A | Discharge status: Home / Self Care | Payer: Self-pay | Attending: Emergency Medicine | Admitting: Emergency Medicine

## 2023-08-17 ENCOUNTER — Encounter (HOSPITAL_COMMUNITY): Payer: Self-pay

## 2023-08-17 DIAGNOSIS — K130 Diseases of lips: Secondary | ICD-10-CM | POA: Insufficient documentation

## 2023-08-17 DIAGNOSIS — Z113 Encounter for screening for infections with a predominantly sexual mode of transmission: Secondary | ICD-10-CM | POA: Insufficient documentation
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
## 2023-08-17 NOTE — Discharge Instructions (Addendum)
 We have swabbed your lesion for herpes simplex virus.  The vaginal swab will check for gonorrhea, chlamydia and trichomoniasis.  Results will be back over the next few days on MyChart in several days. Staff will contact if follow-up care is needed based on results.

## 2023-08-18 ENCOUNTER — Ambulatory Visit (HOSPITAL_COMMUNITY): Payer: Self-pay

## 2023-08-18 LAB — HSV 1/2 PCR (SURFACE)
HSV-1 DNA: DETECTED — AB
HSV-2 DNA: NOT DETECTED

## 2023-08-18 LAB — CERVICOVAGINAL ANCILLARY ONLY
Chlamydia: NEGATIVE
Comment: NEGATIVE
Comment: NEGATIVE
Comment: NORMAL
Neisseria Gonorrhea: NEGATIVE
Trichomonas: NEGATIVE

## 2023-08-22 MED ORDER — VALACYCLOVIR HCL 1 G PO TABS: 1000.0000 mg | ORAL_TABLET | Freq: Two times a day (BID) | ORAL | 0 refills | Status: AC
